# Patient Record
Sex: Male | Born: 1990 | Race: White | Hispanic: No | Marital: Single | Smoking: Current every day smoker
Health system: Southern US, Community
[De-identification: ages and names within clinical notes are randomized; demographics above are authoritative.]

---
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
## 2013-01-03 ENCOUNTER — Emergency Department (HOSPITAL_COMMUNITY): Payer: 59

## 2013-01-03 ENCOUNTER — Emergency Department (HOSPITAL_COMMUNITY)
Admission: EM | Admit: 2013-01-03 | Discharge: 2013-01-03 | Disposition: A | Discharge status: Home / Self Care | Payer: 59 | Attending: Emergency Medicine | Admitting: Emergency Medicine

## 2013-01-03 ENCOUNTER — Encounter (HOSPITAL_COMMUNITY): Payer: Self-pay | Admitting: Emergency Medicine

## 2013-01-03 DIAGNOSIS — Y92410 Unspecified street and highway as the place of occurrence of the external cause: Secondary | ICD-10-CM | POA: Insufficient documentation

## 2013-01-03 DIAGNOSIS — Y9389 Activity, other specified: Secondary | ICD-10-CM | POA: Insufficient documentation

## 2013-01-03 DIAGNOSIS — IMO0002 Reserved for concepts with insufficient information to code with codable children: Secondary | ICD-10-CM | POA: Insufficient documentation

## 2013-01-03 DIAGNOSIS — T07XXXA Unspecified multiple injuries, initial encounter: Secondary | ICD-10-CM

## 2013-01-03 DIAGNOSIS — R404 Transient alteration of awareness: Secondary | ICD-10-CM | POA: Insufficient documentation

## 2013-01-03 MED ORDER — ONDANSETRON HCL 4 MG/2ML IJ SOLN
4.0000 mg | Freq: Once | INTRAMUSCULAR | Status: AC
  Administered 2013-01-03: 4 mg via INTRAVENOUS
  Filled 2013-01-03: qty 2

## 2013-01-03 MED ORDER — HYDROMORPHONE HCL PF 1 MG/ML IJ SOLN
0.5000 mg | Freq: Once | INTRAMUSCULAR | Status: AC
  Administered 2013-01-03: 0.5 mg via INTRAVENOUS
  Filled 2013-01-03: qty 1

## 2013-01-03 MED ORDER — OXYCODONE-ACETAMINOPHEN 5-325 MG PO TABS: 1.0000 | ORAL_TABLET | Freq: Four times a day (QID) | ORAL | Status: DC | PRN

## 2013-01-03 NOTE — ED Notes (Signed)
Patient walked to restroom without incident.
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
## 2013-01-03 NOTE — ED Notes (Signed)
Level 2 trauma activation at 1115. Patient arrival at 1129, Patient downgraded to non trauma at 1134 by Dr. Rubin Payor.
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
## 2013-01-05 ENCOUNTER — Telehealth (HOSPITAL_COMMUNITY): Payer: Self-pay | Admitting: Emergency Medicine

## 2013-01-05 NOTE — Telephone Encounter (Signed)
Left message about appt for 10/29 at 2:15. He is to call back to confirm.

## 2013-01-06 NOTE — Telephone Encounter (Signed)
Moved appointment to tomorrow. Mother may take him to urgent care tonight and if so will call to cancel appointment.

## 2013-01-07 ENCOUNTER — Encounter (INDEPENDENT_AMBULATORY_CARE_PROVIDER_SITE_OTHER): Payer: Self-pay

## 2013-01-07 ENCOUNTER — Telehealth (HOSPITAL_COMMUNITY): Payer: Self-pay | Admitting: Emergency Medicine

## 2013-01-07 NOTE — Telephone Encounter (Signed)
Cancelled appointment

## 2013-01-14 ENCOUNTER — Encounter (INDEPENDENT_AMBULATORY_CARE_PROVIDER_SITE_OTHER): Payer: Self-pay

## 2013-03-27 ENCOUNTER — Other Ambulatory Visit: Payer: Self-pay | Admitting: Family Medicine

## 2013-03-27 ENCOUNTER — Ambulatory Visit
Admission: RE | Admit: 2013-03-27 | Discharge: 2013-03-27 | Disposition: A | Discharge status: Home / Self Care | Payer: 59 | Source: Ambulatory Visit | Attending: Family Medicine | Admitting: Family Medicine

## 2013-03-27 DIAGNOSIS — M545 Low back pain, unspecified: Secondary | ICD-10-CM

## 2016-01-27 ENCOUNTER — Other Ambulatory Visit: Payer: Self-pay | Admitting: Family Medicine

## 2016-01-27 DIAGNOSIS — N5082 Scrotal pain: Secondary | ICD-10-CM

## 2016-01-31 ENCOUNTER — Ambulatory Visit
Admission: RE | Admit: 2016-01-31 | Discharge: 2016-01-31 | Disposition: A | Discharge status: Home / Self Care | Payer: 59 | Source: Ambulatory Visit | Attending: Family Medicine | Admitting: Family Medicine

## 2016-01-31 DIAGNOSIS — N5082 Scrotal pain: Secondary | ICD-10-CM

## 2016-05-10 DIAGNOSIS — M62838 Other muscle spasm: Secondary | ICD-10-CM | POA: Diagnosis not present

## 2016-05-10 DIAGNOSIS — M2480 Other specific joint derangements of unspecified joint, not elsewhere classified: Secondary | ICD-10-CM | POA: Diagnosis not present

## 2016-09-17 DIAGNOSIS — H1031 Unspecified acute conjunctivitis, right eye: Secondary | ICD-10-CM | POA: Diagnosis not present

## 2016-09-17 DIAGNOSIS — H6692 Otitis media, unspecified, left ear: Secondary | ICD-10-CM | POA: Diagnosis not present

## 2017-03-13 DIAGNOSIS — R1084 Generalized abdominal pain: Secondary | ICD-10-CM | POA: Diagnosis not present

## 2017-03-13 DIAGNOSIS — A09 Infectious gastroenteritis and colitis, unspecified: Secondary | ICD-10-CM | POA: Diagnosis not present

## 2017-03-13 DIAGNOSIS — Z23 Encounter for immunization: Secondary | ICD-10-CM | POA: Diagnosis not present

## 2017-03-13 DIAGNOSIS — R110 Nausea: Secondary | ICD-10-CM | POA: Diagnosis not present

## 2017-07-09 ENCOUNTER — Emergency Department (HOSPITAL_COMMUNITY)
Admission: EM | Admit: 2017-07-09 | Discharge: 2017-07-09 | Disposition: A | Discharge status: Home / Self Care | Payer: Worker's Compensation | Attending: Emergency Medicine | Admitting: Emergency Medicine

## 2017-07-09 ENCOUNTER — Encounter (HOSPITAL_COMMUNITY): Payer: Self-pay | Admitting: Emergency Medicine

## 2017-07-09 ENCOUNTER — Other Ambulatory Visit: Payer: Self-pay

## 2017-07-09 DIAGNOSIS — Z79899 Other long term (current) drug therapy: Secondary | ICD-10-CM | POA: Insufficient documentation

## 2017-07-09 DIAGNOSIS — T5891XA Toxic effect of carbon monoxide from unspecified source, accidental (unintentional), initial encounter: Secondary | ICD-10-CM | POA: Insufficient documentation

## 2017-07-09 DIAGNOSIS — R42 Dizziness and giddiness: Secondary | ICD-10-CM | POA: Diagnosis not present

## 2017-07-09 DIAGNOSIS — Z7729 Contact with and (suspected ) exposure to other hazardous substances: Secondary | ICD-10-CM

## 2017-07-09 DIAGNOSIS — F17200 Nicotine dependence, unspecified, uncomplicated: Secondary | ICD-10-CM | POA: Diagnosis not present

## 2017-07-09 DIAGNOSIS — R110 Nausea: Secondary | ICD-10-CM | POA: Diagnosis not present

## 2017-07-09 LAB — I-STAT VENOUS BLOOD GAS, ED
Acid-Base Excess: 3 mmol/L — ABNORMAL HIGH (ref 0.0–2.0)
Bicarbonate: 25.7 mmol/L (ref 20.0–28.0)
O2 Saturation: 94 %
TCO2: 27 mmol/L (ref 22–32)
pCO2, Ven: 32 mmHg — ABNORMAL LOW (ref 44.0–60.0)
pH, Ven: 7.513 — ABNORMAL HIGH (ref 7.250–7.430)
pO2, Ven: 64 mmHg — ABNORMAL HIGH (ref 32.0–45.0)

## 2017-07-09 LAB — CBC WITH DIFFERENTIAL/PLATELET
Basophils Absolute: 0 10*3/uL (ref 0.0–0.1)
Basophils Relative: 0 %
Eosinophils Absolute: 0.4 10*3/uL (ref 0.0–0.7)
Eosinophils Relative: 3 %
HCT: 44.2 % (ref 39.0–52.0)
Hemoglobin: 16 g/dL (ref 13.0–17.0)
Lymphocytes Relative: 20 %
Lymphs Abs: 2.5 10*3/uL (ref 0.7–4.0)
MCH: 31.9 pg (ref 26.0–34.0)
MCHC: 36.2 g/dL — ABNORMAL HIGH (ref 30.0–36.0)
MCV: 88.2 fL (ref 78.0–100.0)
Monocytes Absolute: 1.3 10*3/uL — ABNORMAL HIGH (ref 0.1–1.0)
Monocytes Relative: 10 %
Neutro Abs: 8.5 10*3/uL — ABNORMAL HIGH (ref 1.7–7.7)
Neutrophils Relative %: 67 %
Platelets: 227 10*3/uL (ref 150–400)
RBC: 5.01 MIL/uL (ref 4.22–5.81)
RDW: 12.4 % (ref 11.5–15.5)
WBC: 12.8 10*3/uL — ABNORMAL HIGH (ref 4.0–10.5)

## 2017-07-09 LAB — BASIC METABOLIC PANEL
Anion gap: 10 (ref 5–15)
BUN: 13 mg/dL (ref 6–20)
CO2: 26 mmol/L (ref 22–32)
Calcium: 9.8 mg/dL (ref 8.9–10.3)
Chloride: 104 mmol/L (ref 101–111)
Creatinine, Ser: 1.13 mg/dL (ref 0.61–1.24)
GFR calc Af Amer: >60 mL/min (ref >60)
GFR calc non Af Amer: >60 mL/min (ref >60)
Glucose, Bld: 98 mg/dL (ref 65–99)
Potassium: 3.8 mmol/L (ref 3.5–5.1)
Sodium: 140 mmol/L (ref 135–145)

## 2017-07-09 LAB — CARBOXYHEMOGLOBIN - COOX: Carboxyhemoglobin: 7.1 % (ref 0.5–1.5)

## 2017-07-09 LAB — I-STAT TROPONIN, ED: Troponin i, poc: 0 ng/mL (ref 0.00–0.08)
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
## 2017-07-09 NOTE — ED Provider Notes (Signed)
Patient placed in Quick Look pathway, seen and evaluated   Chief Complaint: Carbon monoxide inhalation  HPI:   Patient presents today after exposure to carbon monoxide.  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  He was wearing his headphones but heard a beeping noise which turned out to be the Carbon monoxide indicator.  He states that he felt lightheaded had a severe frontal headache and palpitations as well as tingling of his face and lips.  He felt short of breath and endorses generalized substernal chest pain which he describes as a tightness.  He endorses syncope of a few seconds but denies head injury.  His symptoms occurred at around 12:30 PM.  States that currently he still feels fatigued but his shortness of breath, palpitations, and headache resolved.  ROS: Positive for syncope, chest pain, shortness of breath, palpitations, headache negative for head injury, vision changes, vomiting  Physical Exam:   Gen: No distress  Neuro: Awake and Alert  Skin: Warm    Focused Exam: Speaking in full sentences without difficulty.  Tolerating secretions without difficulty.  No swelling of lips or tongue.  Pupils equal and reactive to light.  Fluent speech with no evidence of dysarthria or aphasia, no facial droop.  Lungs are clear to auscultation bilaterally.  Heart regular rate and rhythm, no murmurs rubs or gallops.  2+ radial pulses bilaterally   Initiation of care has begun. The patient has been counseled on the process, plan, and necessity for staying for the completion/evaluation, and the remainder of the medical screening examination    Jeanie SewerFawze, Inas Avena A, PA-C 07/09/17 1506    Gwyneth SproutPlunkett, Whitney, MD 07/09/17 2106

## 2017-07-09 NOTE — ED Triage Notes (Signed)
Pt to ER after exposure to high CO levels. 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 States feels much better.
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
## 2018-02-09 DIAGNOSIS — Z23 Encounter for immunization: Secondary | ICD-10-CM | POA: Diagnosis not present

## 2018-07-09 IMAGING — US US SCROTUM
1 series · 14 of 25 positions shown · non-contrast
Comparison: None.

CLINICAL DATA: Scrotal pain

EXAM:
SCROTAL ULTRASOUND
DOPPLER ULTRASOUND OF THE TESTICLES
TECHNIQUE: Complete ultrasound examination of the testicles, epididymis, and
other scrotal structures was performed. Color and spectral Doppler
ultrasound were also utilized to evaluate blood flow to the
testicles.

[Series 1: us scrotum · 0.08mm/px · 14 of 73 slices shown]
[im 1/73]
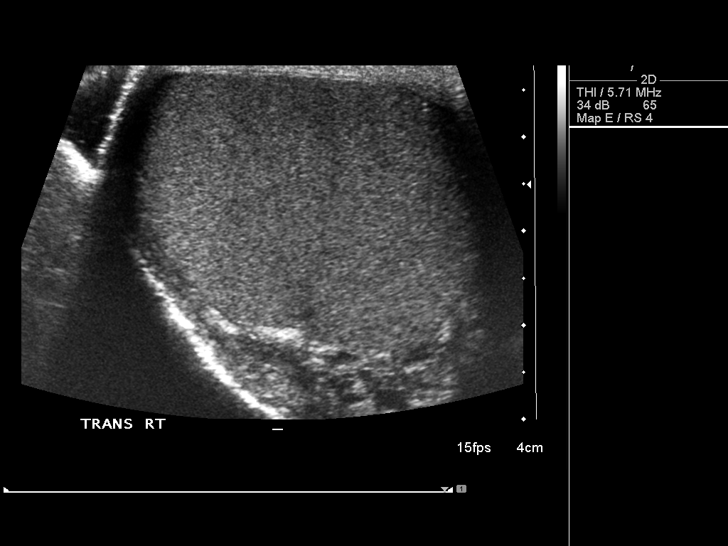
[im 7/73]
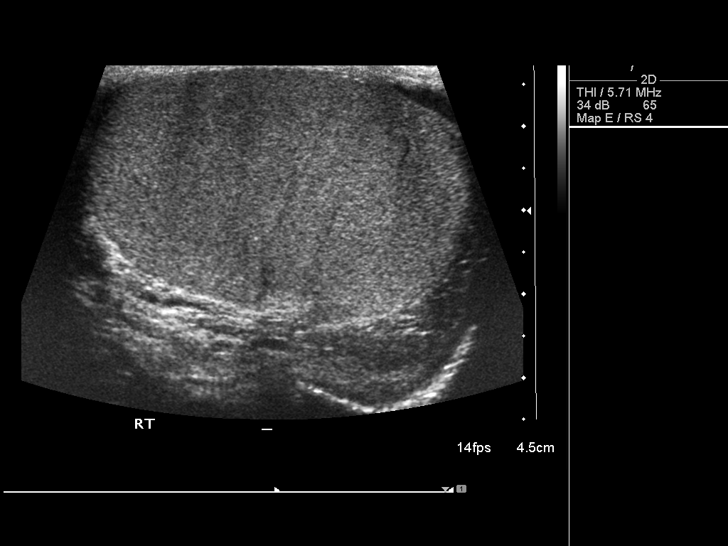
[im 13/73]
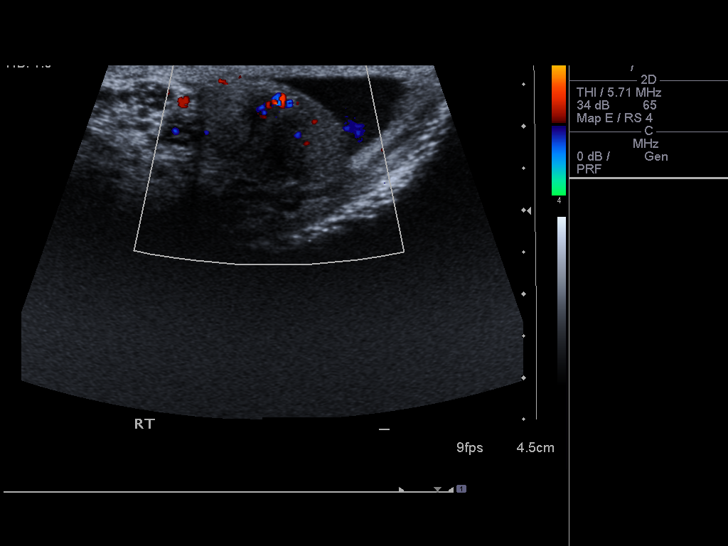
[im 19/73]
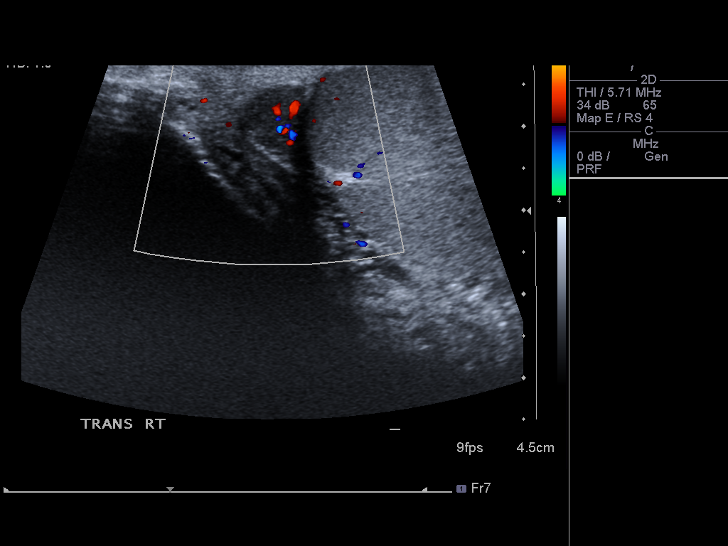
[im 25/73]
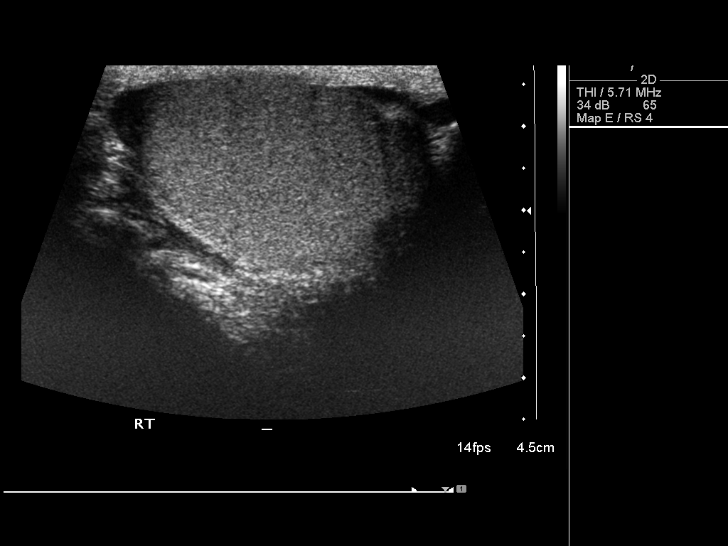
[im 28/73]
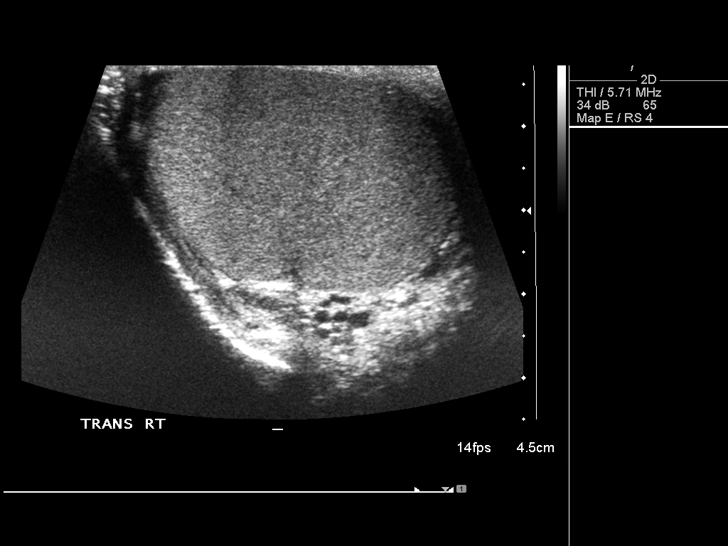
[im 34/73]
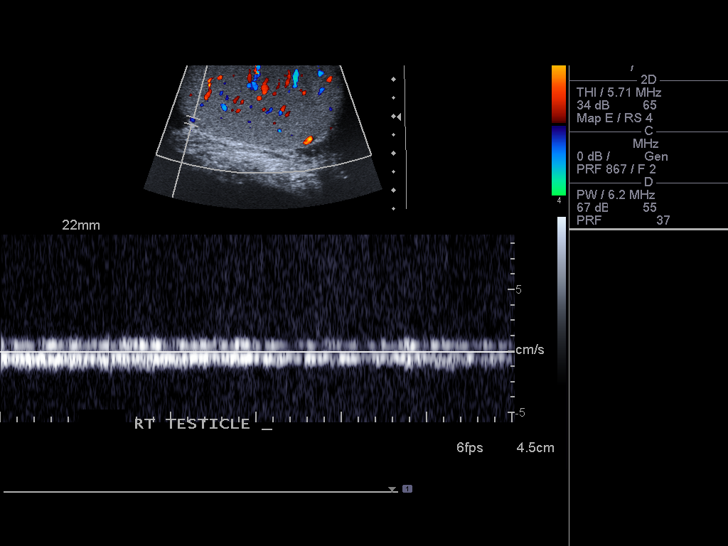
[im 40/73]
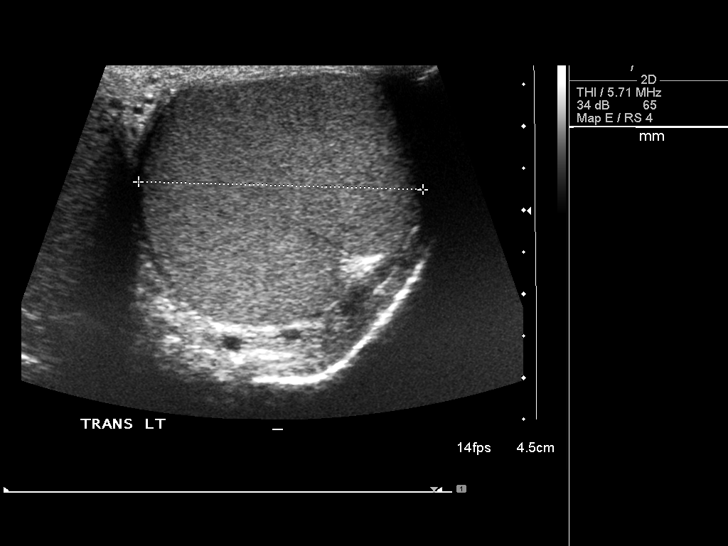
[im 46/73]
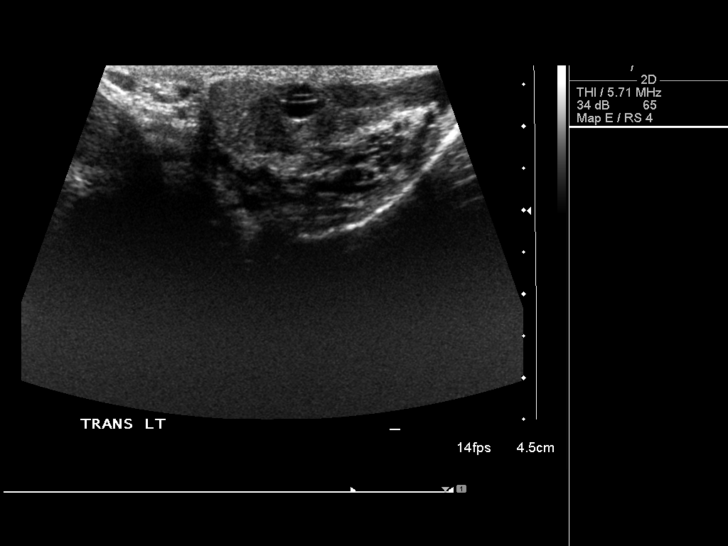
[im 49/73]
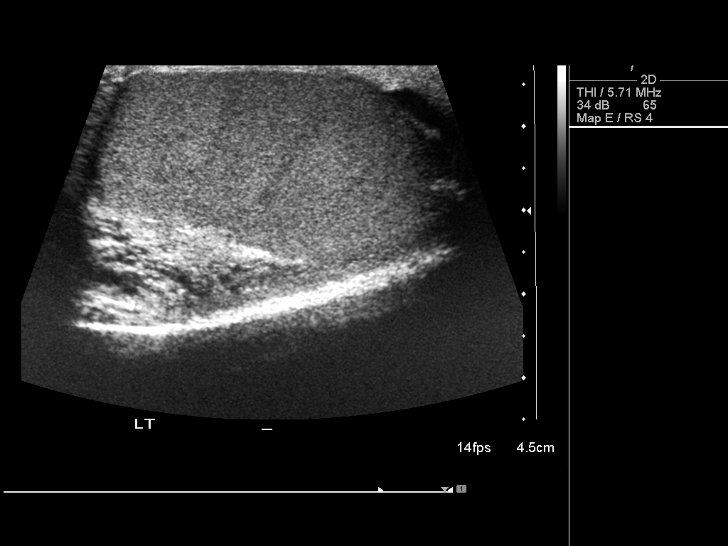
[im 55/73]
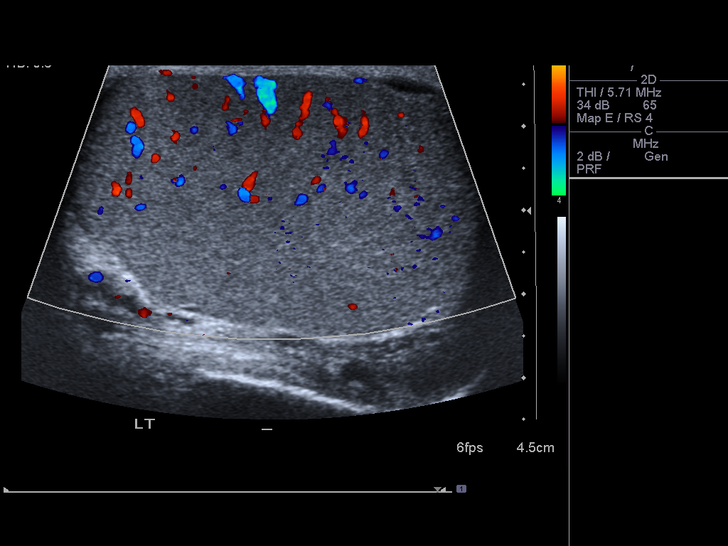
[im 61/73]
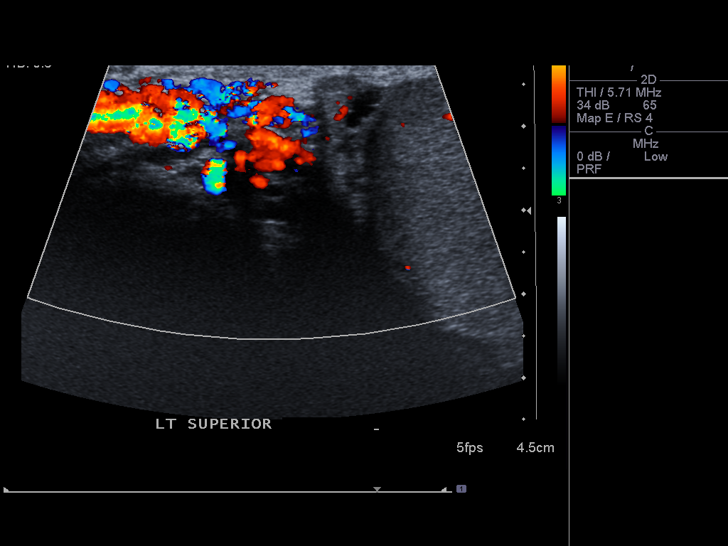
[im 67/73]
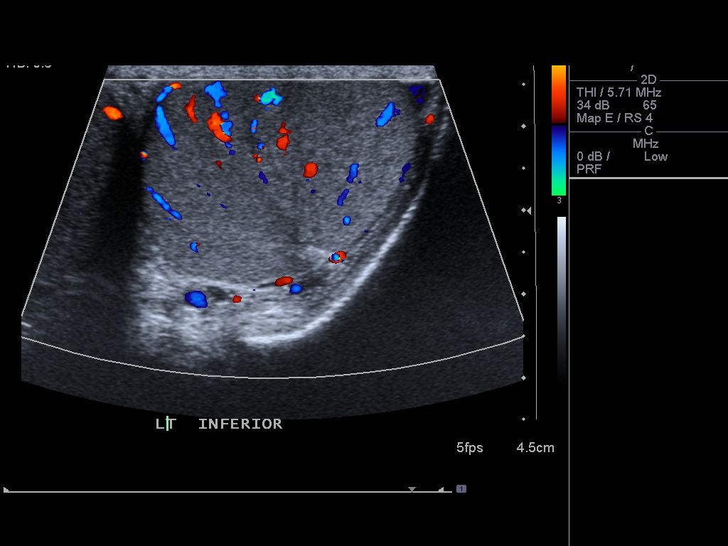
[im 73/73]
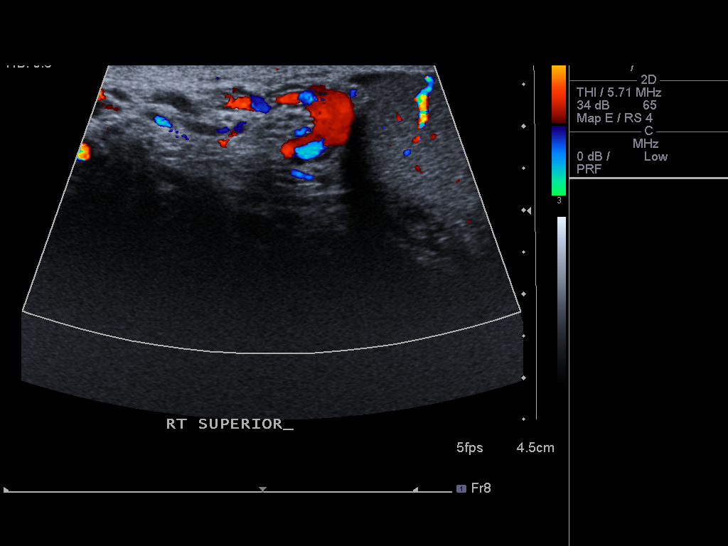

[14 of 25 positions shown; findings below may reference images not displayed]

FINDINGS: Right testicle

Measurements: 4.6 x 3.0 x 3.7 cm. No mass or microlithiasis
visualized.

Left testicle

Measurements: 5.1 x 3.1 x 3.4 cm. No mass or microlithiasis
visualized.

Right epididymis:  Normal in size and appearance.

Left epididymis:  8 mm cyst with thin internal septation.

Hydrocele:  Small bilateral

Varicocele:  None visualized.

Pulsed Doppler interrogation of both testes demonstrates normal low
resistance arterial and venous waveforms bilaterally.
IMPRESSION: No testicular abnormality.

Small bilateral hydroceles.
